# Patient Record
Sex: Male | Born: 1965 | Race: White | Hispanic: No
Health system: Southern US, Community
[De-identification: ages and names within clinical notes are randomized; demographics above are authoritative.]

---

## 2010-11-20 ENCOUNTER — Ambulatory Visit: Payer: Self-pay | Admitting: Internal Medicine

## 2011-04-21 ENCOUNTER — Ambulatory Visit: Payer: Self-pay | Admitting: Oncology

## 2011-05-20 ENCOUNTER — Ambulatory Visit: Payer: Self-pay | Admitting: Oncology

## 2011-05-22 ENCOUNTER — Ambulatory Visit: Payer: Self-pay | Admitting: Oncology

## 2011-06-22 ENCOUNTER — Ambulatory Visit: Payer: Self-pay | Admitting: Oncology

## 2011-07-22 ENCOUNTER — Ambulatory Visit: Payer: Self-pay | Admitting: Oncology

## 2011-08-22 ENCOUNTER — Ambulatory Visit: Payer: Self-pay | Admitting: Oncology

## 2011-09-21 ENCOUNTER — Ambulatory Visit: Payer: Self-pay | Admitting: Oncology

## 2011-10-30 ENCOUNTER — Ambulatory Visit: Payer: Self-pay | Admitting: Oncology

## 2011-10-30 LAB — CBC CANCER CENTER
Basophil #: 0 x10 3/mm (ref 0.0–0.1)
Basophil %: 0.4 %
Eosinophil #: 0.3 x10 3/mm (ref 0.0–0.7)
HCT: 40.5 % (ref 40.0–52.0)
HGB: 14.3 g/dL (ref 13.0–18.0)
Lymphocyte #: 0.9 x10 3/mm — ABNORMAL LOW (ref 1.0–3.6)
Lymphocyte %: 17.1 %
MCH: 34.3 pg — ABNORMAL HIGH (ref 26.0–34.0)
MCV: 97 fL (ref 80–100)
Monocyte %: 8.6 %
Neutrophil %: 68.8 %
RBC: 4.16 10*6/uL — ABNORMAL LOW (ref 4.40–5.90)
RDW: 12.9 % (ref 11.5–14.5)
WBC: 5.2 x10 3/mm (ref 3.8–10.6)

## 2011-10-30 LAB — TSH: Thyroid Stimulating Horm: 0.778 u[IU]/mL

## 2011-10-30 LAB — COMPREHENSIVE METABOLIC PANEL
Albumin: 4 g/dL (ref 3.4–5.0)
Anion Gap: 9 (ref 7–16)
Calcium, Total: 8.9 mg/dL (ref 8.5–10.1)
Creatinine: 0.72 mg/dL (ref 0.60–1.30)
EGFR (African American): >60
Glucose: 105 mg/dL — ABNORMAL HIGH (ref 65–99)
Potassium: 4 mmol/L (ref 3.5–5.1)
SGOT(AST): 13 U/L — ABNORMAL LOW (ref 15–37)

## 2011-11-04 ENCOUNTER — Ambulatory Visit: Payer: Self-pay | Admitting: Otolaryngology

## 2011-11-05 LAB — PATHOLOGY REPORT

## 2011-11-07 ENCOUNTER — Ambulatory Visit: Payer: Self-pay | Admitting: Oncology

## 2011-11-11 ENCOUNTER — Inpatient Hospital Stay: Payer: Self-pay | Admitting: Otolaryngology

## 2011-11-12 LAB — CBC WITH DIFFERENTIAL/PLATELET
Basophil #: 0 10*3/uL (ref 0.0–0.1)
Basophil %: 0.2 %
Eosinophil %: 0 %
HCT: 37.7 % — ABNORMAL LOW (ref 40.0–52.0)
HGB: 13 g/dL (ref 13.0–18.0)
Lymphocyte #: 0.5 10*3/uL — ABNORMAL LOW (ref 1.0–3.6)
Lymphocyte %: 4.8 %
MCH: 33.6 pg (ref 26.0–34.0)
MCV: 97 fL (ref 80–100)
Monocyte %: 3.8 %
Neutrophil #: 8.8 10*3/uL — ABNORMAL HIGH (ref 1.4–6.5)
RBC: 3.88 10*6/uL — ABNORMAL LOW (ref 4.40–5.90)
WBC: 9.6 10*3/uL (ref 3.8–10.6)

## 2011-11-12 LAB — BASIC METABOLIC PANEL
Anion Gap: 7 (ref 7–16)
BUN: 6 mg/dL — ABNORMAL LOW (ref 7–18)
Creatinine: 0.64 mg/dL (ref 0.60–1.30)
EGFR (African American): >60
Glucose: 197 mg/dL — ABNORMAL HIGH (ref 65–99)
Potassium: 3.8 mmol/L (ref 3.5–5.1)
Sodium: 138 mmol/L (ref 136–145)

## 2011-11-15 LAB — PATHOLOGY REPORT

## 2011-11-22 ENCOUNTER — Ambulatory Visit: Payer: Self-pay | Admitting: Oncology

## 2013-04-21 IMAGING — CT NM PET TUM IMG RESTAG (PS) SKULL BASE T - THIGH
1 of 5 series · 1 of 25 positions shown · non-contrast
Comparison: none

REASON FOR EXAM: Recurrent Tongue CA Restaging
COMMENTS:

PROCEDURE:     PET - PET/CT RESTG HEAD/NECK CA  - November 07, 2011 [DATE]
RESULT:     Comparison: PET CT 05/22/2011
Radiopharmaceutical: 12.72 mCi F18-FDG, intravenously.
TECHNIQUE: Imaging was performed from the skull base to the mid-thigh using
routine PET/CT acquisition protocol. Dedicated PET CT images were obtained
of the head and neck.
Injection site: Left antecubital fossa
Time of FDG injection: 1541 hours
Serum glucose: 90 mg/dL
Time of imaging: 2544 hours through 4828 hours and 0040 hours through 8888
hours

[Series 3: ct wb 3.0 b30f · axial · 3.0mm · 0.98mm/px · 1 of 494 slices shown]
[im 444/494  brain]
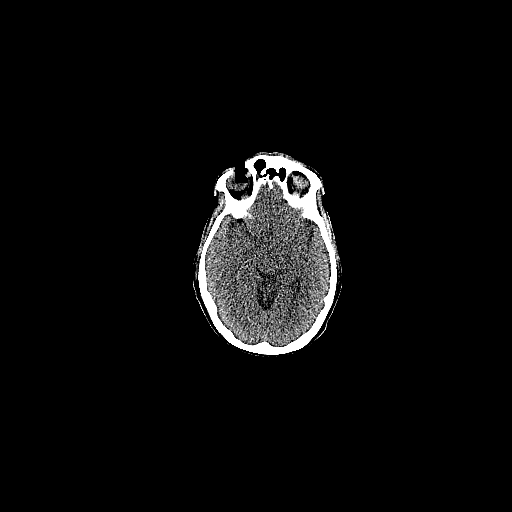

[1 of 25 positions shown; findings below may reference images not displayed]

FINDINGS: There is hypermetabolic activity between the left side of the tongue and the
body of the mandible. It measures SUV max 6.0. This is nonspecific. However,
residual/recurrent malignancy is of differential consideration. Linear
activity along the muscles of the right side of the neck is nonspecific, and
may be physiologic. No definite hypermetabolic lymph nodes identified in the
neck. There is a low-attenuation lesion in the right lobe of the thyroid.
This is nonspecific, and demonstrates metabolic activity similar to the
remainder of the thyroid gland.

No hypermetabolic mediastinal, hilar, or axillary lymph nodes identified.

No abnormal hypermetabolic activity identified in the abdomen or pelvis.
There is physiologic radiotracer activity in the bowel and urinary system.
IMPRESSION: 1. Focal hypermetabolic activity between the left side of the tongue and the
mandible is nonspecific, but concerning for residual/recurrent malignancy.
Recommend correlation with direct visualization.
2. No evidence of metastatic disease in the neck, chest, abdomen, or pelvis.
3. Nonspecific right thyroid lobe nodule, which demonstrates metabolic
activity similar to the remainder of the thyroid gland. Further evaluation
could be provided with ultrasound, as indicated.

## 2014-04-11 ENCOUNTER — Encounter (HOSPITAL_BASED_OUTPATIENT_CLINIC_OR_DEPARTMENT_OTHER): Payer: Self-pay | Attending: Plastic Surgery

## 2014-04-11 DIAGNOSIS — Z87891 Personal history of nicotine dependence: Secondary | ICD-10-CM | POA: Insufficient documentation

## 2014-04-11 DIAGNOSIS — M278 Other specified diseases of jaws: Secondary | ICD-10-CM | POA: Insufficient documentation

## 2014-04-11 DIAGNOSIS — J351 Hypertrophy of tonsils: Secondary | ICD-10-CM | POA: Insufficient documentation

## 2014-04-11 DIAGNOSIS — R259 Unspecified abnormal involuntary movements: Secondary | ICD-10-CM | POA: Insufficient documentation

## 2014-04-11 DIAGNOSIS — K117 Disturbances of salivary secretion: Secondary | ICD-10-CM | POA: Insufficient documentation

## 2014-04-11 DIAGNOSIS — Y842 Radiological procedure and radiotherapy as the cause of abnormal reaction of the patient, or of later complication, without mention of misadventure at the time of the procedure: Secondary | ICD-10-CM | POA: Insufficient documentation

## 2014-04-11 DIAGNOSIS — K029 Dental caries, unspecified: Secondary | ICD-10-CM | POA: Insufficient documentation

## 2014-04-11 DIAGNOSIS — Z85810 Personal history of malignant neoplasm of tongue: Secondary | ICD-10-CM | POA: Insufficient documentation

## 2014-04-12 NOTE — Progress Notes (Signed)
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  He has some trismus and an open external wound.  The history is one, in which, he had tongue cancer.  He also has xerostomia, trismus and dental neglect.  He has an interincisal opening approximately 1 cm draining sinus on the left portion of the mandible at the angle.  He has a Mallampati II, grade 2 tonsillar hypertrophy.  He has very poor oral hygiene.  He has some trismus, so is unable to open all the way.  His Panorex was done showing multiple dental caries and evidence of destruction of the left mandible associated with, they were indicative of from osteoradionecrosis.  There is some tenderness in the area.  His past medical history is positive for tongue cancer, radiation therapy, dental neglect, trismus, osteoradionecrosis, xerostomia and he has had a hemiglossectomy with the skin graft for oral surgery.  His medications include Augmentin and Percocet and he is allergic to CLEOCIN.  He does have a history of tobacco use.  On exam, he is alert, oriented, cooperative, not in any acute distress. He is pleasant.  He seems to understand his condition.  Pupils are equal.  Extraocular muscles are intact.  He has left-sided tenderness, the wound as described above with an oral fistula, about 2.5 cm in size, poor dental hygiene, xerostomia.  His breathing is unlabored.  His heart rate is regular.  His abdomen is soft and nontender.  His pulses are positive and regular.  Recommendation is for hyperbaric oxygen treatment and collagen on the open wound and see  him back in 1 week for followup.  Maximizing nutritional status will be of help as well.     Wayland Denislaire Sanger, DO     CS/MEDQ  D:  04/11/2014  T:  04/12/2014  Job:  [id number]

## 2015-02-12 NOTE — Op Note (Signed)
PATIENT NAME:  Nicholas Castillo, Nicholas Castillo MR#:  [id number] DATE OF BIRTH:  02/11/1966  DATE OF PROCEDURE:  11/11/2011  PREOPERATIVE DIAGNOSIS: Recurrent squamous cell carcinoma, left lateral tongue.   POSTOPERATIVE DIAGNOSIS: Recurrent squamous cell carcinoma, left lateral tongue.  OPERATIVE PROCEDURES:  1. Left hemiglossectomy. 2. Split thickness skin graft to left tongue from left thigh.    SURGEON: Cammy Copa, MD    ANESTHESIA: General.   COMPLICATIONS: None.   TOTAL ESTIMATED BLOOD LOSS: 100 mL.   PROCEDURE: The patient was given general anesthesia by oral endotracheal intubation. Tube was brought out the right side of the mouth. A mouthpiece was placed to keep the mouth open and visualize the tongue. This was a 2.5 cm squarish ulcerative lesion in the left lateral tongue. This was invading inward and was about 3/16 of an inch deep all the way around the outside. It had been biopsied one week ago and showed positive squamous cell carcinoma.   The tongue was grasped and pulled anteriorly and the area was marked all the way around this taking a good 1 cm margin of healthy tissue all the way around. Electrocautery was used for incising the tongue. This was carried down through the muscular tissue as well. Bleeding was controlled with electrocautery as we went. Coming around the back side of the tongue, the most posterior base of tongue was left intact but the anterior two-thirds to three-quarters of the left half of the tongue was removed. A little bit of extra tip was left on the left side. The lingual artery was found inferiorly and this was crossclamped and tied. The lingual nerve was found as it branched up into the anterior tongue musculature and that was also tied off. Rest of the bleeding was controlled with electrocautery. The entire specimen was removed. This went down to the left floor of mouth but still with a margin. There was just a little bit of mucosal tissue left before the gingiva  of the teeth. The wound was irrigated. There was no further significant bleeding.   Split thickness skin graft was taken from the left thigh. This was taken 3 cm wide and approximately 5 cm in length. The thickness was 0.17 inches of thickness. The area had been prepped and draped in a sterile fashion earlier. Oil was placed over the skin and then using a tongue depressor the skin was flattened out so it could take the skin graft. Skin graft was intact. The area was covered with Xylocaine with epi and a gauze first and then once vasoconstriction occurred Vaseline gauze was then placed over the wound and stapled into position. It was then covered with very fluffy pressure dressing.   The split-thickness skin graft was then sutured in front of the tongue and sewn all the way around where it was flat across the tongue and this extended down to the floor of the mouth and covered the entire left side of the tongue. This was done with interrupted 4-0 Vicryls. The interrupted sutures were then placed in the middle portion of the tongue to tack down the middle to make sure that it did not shift. Small little incisions were then created in the graft to make sure that no air or blood would collect underneath the graft and had a place to come out.   The patient tolerated the procedure very well. He was awakened and taken to the recovery room in satisfactory condition. There were no operative complications.    ____________________________ Beverly Sessions  Elenore RotaJuengel, MD phj:drc D: 11/12/2011 09:22:16 ET T: 11/12/2011 10:13:23 ET JOB#: [id number]  cc: Cammy CopaPaul H. Kalah Pflum, MD, <Dictator> Cammy CopaPAUL H Wells Mabe MD ELECTRONICALLY SIGNED 11/21/2011 8:26

## 2015-02-12 NOTE — Op Note (Signed)
PATIENT NAME:  Nicholas Castillo, Willem E MR#:  [id number] DATE OF BIRTH:  16-Feb-1966  DATE OF PROCEDURE:  11/04/2011  PREOPERATIVE DIAGNOSIS: Left tongue lesion with suspicion of cancer.   POSTOPERATIVE DIAGNOSIS: Left tongue lesion with suspicion of cancer.  OPERATIVE PROCEDURE:  1. Direct laryngoscopy. 2. Biopsy of left tongue.   SURGEON: Vernie MurdersPaul Brevon Dewald, MD   ANESTHESIA: General.   COMPLICATIONS: None.   TOTAL ESTIMATED BLOOD LOSS: Minimal.   INDICATIONS: The patient has had previous squamous cell carcinoma of the left tongue with first primary excision and then secondary recurrence and treated with chemotherapy and radiation. He has a nonhealing ulcer that has been giving him more pain, and there is suspicion that there is still residual cancer.   PROCEDURE: The patient was given general anesthesia by oral endotracheal intubation. A Dedo laryngoscope was used for visualization of the hypopharynx and larynx. The base of the tongue was clear. The epiglottis looked normal. The vocal cords were pearly white. The arytenoids had slight watery edema typical of some radiation. There were no lesions seen in the hypopharynx or larynx area. The left lateral tongue had an ulceration that was almost exactly 1-inch round, slightly irregular on the outside surface. It had a lot of food debris in it. It was cleaned out, and pictures were taken of the ulceration. Biopsies were taken with medium cup biting forceps all the way around the outside and the base going down into some of the muscle fibers. The biopsies were sent for permanent section. The area had slight ooze from it. Electrocautery was used to help control the bleeding. There was about a 1 cm area of induration all the way around the lesion.    The patient tolerated the procedure well. He was awakened and taken to the recovery room in satisfactory condition. There were no operative complications.   ____________________________ Cammy CopaPaul H. Vernita Tague,  MD phj:cbb D: 11/04/2011 12:45:08 ET T: 11/04/2011 13:04:57 ET JOB#: [id number]  cc: Cammy CopaPaul H. Loralee Weitzman, MD, <Dictator> Cammy CopaPAUL H Abia Monaco MD ELECTRONICALLY SIGNED 11/08/2011 20:28
